# Patient Record
Sex: Male | Born: 1985 | Race: Black or African American | Hispanic: No | Marital: Married | Smoking: Current every day smoker
Health system: Southern US, Community
[De-identification: ages and names within clinical notes are randomized; demographics above are authoritative.]

---
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
## 2009-06-17 ENCOUNTER — Emergency Department (HOSPITAL_COMMUNITY): Admission: EM | Admit: 2009-06-17 | Discharge: 2009-06-17 | Payer: Self-pay | Admitting: Emergency Medicine
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
## 2010-08-16 ENCOUNTER — Emergency Department (HOSPITAL_COMMUNITY): Admission: EM | Admit: 2010-08-16 | Discharge: 2010-08-17 | Payer: Self-pay | Admitting: Emergency Medicine

## 2011-02-14 IMAGING — CR DG CHEST 1V PORT
1 series · 1 of 1 positions shown · non-contrast
Comparison: None.

CLINICAL DATA: 23-year-old male with chest pain, fever.

PORTABLE CHEST - 1 VIEW

[view not recorded]
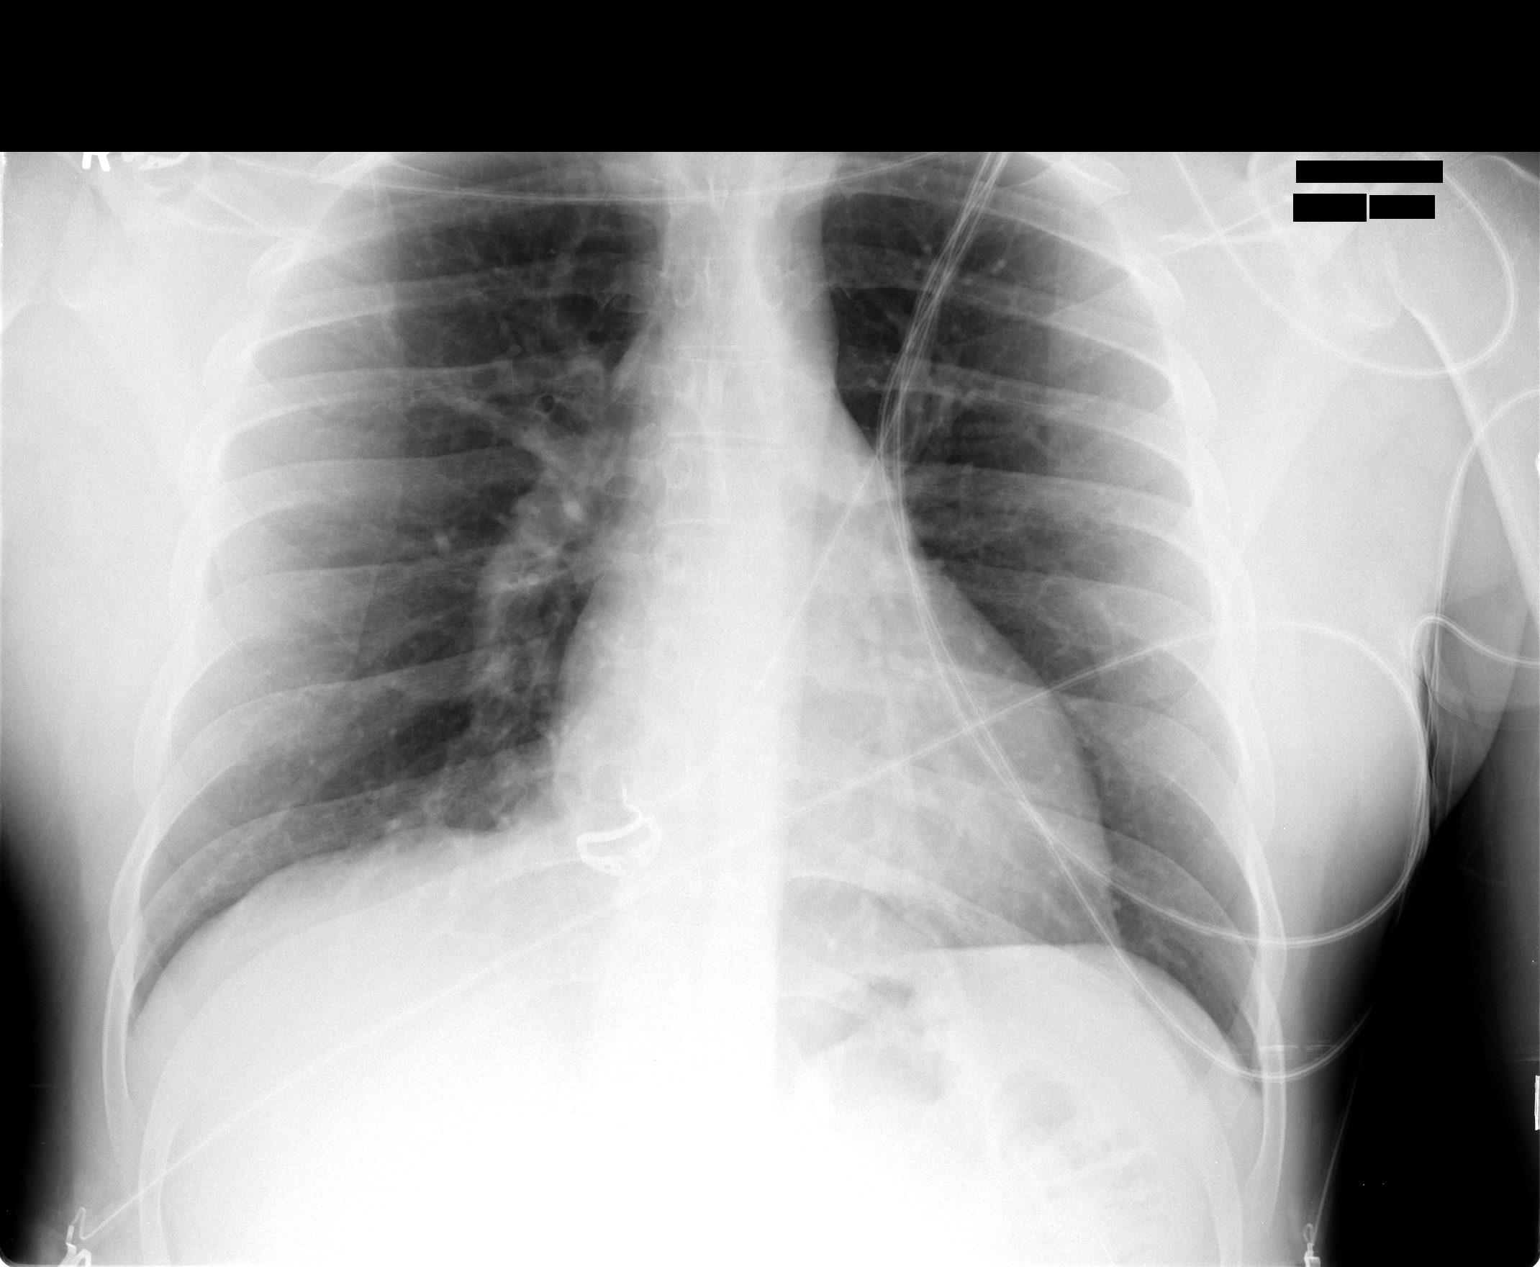

[1 of 1 positions shown; findings below may reference images not displayed]

FINDINGS: AP portable semi upright view at 6609 hours.  Lung
volumes are within normal limits.  Cardiac size and mediastinal
contours are within normal limits.  Visualized tracheal air column
is within normal limits.  Allowing for portable technique, the
lungs are clear.  No pneumothorax or effusion.
IMPRESSION: Negative, no acute cardiopulmonary abnormality.

## 2011-03-10 LAB — CBC
HCT: 44.8 % (ref 39.0–52.0)
Hemoglobin: 14.9 g/dL (ref 13.0–17.0)
MCH: 28.8 pg (ref 26.0–34.0)
MCHC: 33.3 g/dL (ref 30.0–36.0)
MCV: 86.4 fL (ref 78.0–100.0)
Platelets: 188 10*3/uL (ref 150–400)
RBC: 5.19 MIL/uL (ref 4.22–5.81)
RDW: 13.7 % (ref 11.5–15.5)
WBC: 10.8 10*3/uL — ABNORMAL HIGH (ref 4.0–10.5)

## 2011-03-10 LAB — BASIC METABOLIC PANEL
BUN: 10 mg/dL (ref 6–23)
CO2: 27 mEq/L (ref 19–32)
Calcium: 9.3 mg/dL (ref 8.4–10.5)
Chloride: 103 mEq/L (ref 96–112)
Creatinine, Ser: 1.18 mg/dL (ref 0.4–1.5)
GFR calc Af Amer: >60 mL/min (ref >60)
GFR calc non Af Amer: >60 mL/min (ref >60)
Glucose, Bld: 98 mg/dL (ref 70–99)
Potassium: 3.8 mEq/L (ref 3.5–5.1)
Sodium: 137 mEq/L (ref 135–145)

## 2011-03-10 LAB — DIFFERENTIAL
Basophils Relative: 0 % (ref 0–1)
Eosinophils Absolute: 0 10*3/uL (ref 0.0–0.7)
Eosinophils Relative: 0 % (ref 0–5)
Lymphs Abs: 0.4 10*3/uL — ABNORMAL LOW (ref 0.7–4.0)
Monocytes Absolute: 0.8 10*3/uL (ref 0.1–1.0)
Monocytes Relative: 7 % (ref 3–12)

## 2011-03-10 LAB — POCT CARDIAC MARKERS
CKMB, poc: <1 ng/mL — ABNORMAL LOW (ref 1.0–8.0)
Myoglobin, poc: 42.8 ng/mL (ref 12–200)
Myoglobin, poc: 45.2 ng/mL (ref 12–200)
Troponin i, poc: <0.05 ng/mL (ref 0.00–0.09)

## 2012-06-10 ENCOUNTER — Emergency Department (HOSPITAL_COMMUNITY): Payer: BC Managed Care – PPO

## 2012-06-10 ENCOUNTER — Encounter (HOSPITAL_COMMUNITY): Payer: Self-pay | Admitting: Emergency Medicine

## 2012-06-10 ENCOUNTER — Emergency Department (HOSPITAL_COMMUNITY)
Admission: EM | Admit: 2012-06-10 | Discharge: 2012-06-10 | Disposition: A | Discharge status: Home / Self Care | Payer: BC Managed Care – PPO | Attending: Emergency Medicine | Admitting: Emergency Medicine

## 2012-06-10 DIAGNOSIS — R079 Chest pain, unspecified: Secondary | ICD-10-CM | POA: Insufficient documentation

## 2012-06-10 LAB — DIFFERENTIAL
Basophils Absolute: 0 K/uL (ref 0.0–0.1)
Basophils Relative: 0 % (ref 0–1)
Eosinophils Absolute: 0.1 K/uL (ref 0.0–0.7)
Eosinophils Relative: 1 % (ref 0–5)
Lymphocytes Relative: 19 % (ref 12–46)
Lymphs Abs: 1.8 K/uL (ref 0.7–4.0)
Monocytes Absolute: 0.8 K/uL (ref 0.1–1.0)
Monocytes Relative: 8 % (ref 3–12)
Neutro Abs: 7 K/uL (ref 1.7–7.7)
Neutrophils Relative %: 72 % (ref 43–77)

## 2012-06-10 LAB — BASIC METABOLIC PANEL WITH GFR
BUN: 13 mg/dL (ref 6–23)
CO2: 23 meq/L (ref 19–32)
Calcium: 8.9 mg/dL (ref 8.4–10.5)
Chloride: 107 meq/L (ref 96–112)
Creatinine, Ser: 1.03 mg/dL (ref 0.50–1.35)
GFR calc Af Amer: >90 mL/min
GFR calc non Af Amer: >90 mL/min
Glucose, Bld: 94 mg/dL (ref 70–99)
Potassium: 3.5 meq/L (ref 3.5–5.1)
Sodium: 141 meq/L (ref 135–145)

## 2012-06-10 LAB — POCT I-STAT TROPONIN I
Troponin i, poc: 0 ng/mL (ref 0.00–0.08)
Troponin i, poc: 0 ng/mL (ref 0.00–0.08)

## 2012-06-10 LAB — CBC
MCV: 82.9 fL (ref 78.0–100.0)
Platelets: 207 10*3/uL (ref 150–400)
RBC: 4.92 MIL/uL (ref 4.22–5.81)
RDW: 12.8 % (ref 11.5–15.5)
WBC: 9.8 10*3/uL (ref 4.0–10.5)

## 2012-06-10 MED ORDER — NITROGLYCERIN 2 % TD OINT
1.0000 [in_us] | TOPICAL_OINTMENT | Freq: Once | TRANSDERMAL | Status: AC
  Administered 2012-06-10: 1 [in_us] via TOPICAL
  Filled 2012-06-10: qty 1

## 2012-06-10 NOTE — ED Provider Notes (Signed)
History     CSN: [id number]  Arrival date & time 06/10/12  1718   First MD Initiated Contact with Patient 06/10/12 1726      Chief Complaint  Patient presents with  . Chest Pain     Patient is a 26 y.o. male presenting with chest pain. The history is provided by the patient and the EMS personnel.  Chest Pain The chest pain began yesterday. Duration of episode(s) is 3 seconds. Chest pain occurs intermittently (every several minutes). The chest pain is resolved. The pain is associated with stress and exertion. The severity of the pain is moderate. The quality of the pain is described as aching, dull and heavy. Chest pain is worsened by stress and exertion. Pertinent negatives for primary symptoms include no fever, no shortness of breath, no cough, no wheezing, no palpitations, no abdominal pain, no nausea, no vomiting and no dizziness.  Pertinent negatives for associated symptoms include no diaphoresis and no weakness. He tried nitroglycerin for the symptoms. Risk factors include obesity and male gender.  Pertinent negatives for past medical history include no CAD, no cancer, no COPD, no CHF, no DVT, no MI and no PE.  Pertinent negatives for family medical history include: family history of aortic dissection, no CAD in family, no early MI in family, no PE in family and no sudden death in family.     No past medical history on file.  No past surgical history on file.  No family history on file.  History  Substance Use Topics  . Smoking status: Not on file  . Smokeless tobacco: Not on file  . Alcohol Use: Not on file      Review of Systems  Constitutional: Negative for fever, chills, diaphoresis, activity change and appetite change.  HENT: Negative for neck pain.   Respiratory: Positive for chest tightness. Negative for cough, shortness of breath and wheezing.   Cardiovascular: Positive for chest pain. Negative for palpitations and leg swelling.  Gastrointestinal: Negative for  nausea, vomiting, abdominal pain, diarrhea and constipation.  Skin: Negative for rash and wound.  Neurological: Negative for dizziness, weakness, light-headedness and headaches.  All other systems reviewed and are negative.    Allergies  Review of patient's allergies indicates no known allergies.  Home Medications  No current outpatient prescriptions on file.  BP 136/78  Temp 98.2 F (36.8 C) (Oral)  Resp 10  SpO2 98%  Physical Exam  Nursing note and vitals reviewed. Constitutional: He appears well-developed and well-nourished.       Overweight   HENT:  Head: Normocephalic and atraumatic.  Right Ear: External ear normal.  Left Ear: External ear normal.  Nose: Nose normal.  Mouth/Throat: Oropharynx is clear and moist. No oropharyngeal exudate.  Eyes: Conjunctivae are normal. Pupils are equal, round, and reactive to light.  Neck: Normal range of motion. Neck supple.  Cardiovascular: Normal rate, regular rhythm, normal heart sounds and intact distal pulses.   Pulmonary/Chest: Effort normal and breath sounds normal. No respiratory distress. He has no wheezes. He has no rales. He exhibits no tenderness.  Abdominal: Soft. Bowel sounds are normal. He exhibits no distension and no mass. There is no tenderness. There is no rebound and no guarding.  Musculoskeletal: Normal range of motion. He exhibits no edema and no tenderness.  Neurological: He is alert. He displays normal reflexes. No cranial nerve deficit. He exhibits normal muscle tone. Coordination normal.  Skin: Skin is warm and dry. No rash noted. No erythema. No pallor.  Psychiatric: He has a normal mood and affect. His behavior is normal. Judgment and thought content normal.    ED Course  Procedures (including critical care time)   Labs Reviewed  CBC  DIFFERENTIAL  BASIC METABOLIC PANEL  POCT I-STAT TROPONIN I  POCT I-STAT TROPONIN I   Dg Chest Portable 1 View  06/10/2012  *RADIOLOGY REPORT*  Clinical Data:  Midline chest pain.  CHEST - 1 VIEW  Comparison:  08/16/2010  Findings: The heart size and mediastinal contours are within normal limits.  Both lungs are clear.  IMPRESSION: No active disease.  Original Report Authenticated By: Danae Orleans, M.D.     1. Chest pain      Date: 06/10/2012  Rate: 89 bpm  Rhythm: normal sinus rhythm  QRS Axis: normal  Intervals: normal  ST/T Wave abnormalities: ST depressions inferiorly and laterally; J point elevation in septal leads  Conduction Disutrbances:none  Narrative Interpretation: J point elevation likely early repolarization  Old EKG Reviewed: changes noted; new inverted T waves in inferior leads (compared with prior EKG-08/16/10)    MDM  26 yo M presents for intermittent chest pain without associated symptoms. EKG via EMS concerning for ST elevation in anterior leads; however, ST elevation of EKG unchanged from previous EKG in August 2011. Cardiology at bedside at arrival and agrees this is not a ST elevation MI. 324mg  ASA given PTA. Pt chest pain free on arrival; nitropaste provided. Serial troponins negative. Chest pain did not return during ED visit. CXR not concerning for PNA or PTX. Suspect costochondritis; discussed usage of NSAID's. Patient given return precautions, including worsening of signs or symptoms. Patient instructed to follow-up with primary care physician.          Clemetine Marker, MD 06/10/12 2303

## 2012-06-10 NOTE — Discharge Instructions (Signed)
Chest Pain (Nonspecific) Chest pain has many causes. Your pain could be caused by something serious, such as a heart attack or a blood clot in the lungs. It could also be caused by something less serious, such as a chest bruise or a virus. Follow up with your doctor. More lab tests or other studies may be needed to find the cause of your pain. Most of the time, nonspecific chest pain will improve within 2 to 3 days of rest and mild pain medicine. HOME CARE  For chest bruises, you may put ice on the sore area for 15 to 20 minutes, 3 to 4 times a day. Do this only if it makes you or your child feel better.   Put ice in a plastic bag.   Place a towel between the skin and the bag.   Rest for the next 2 to 3 days.   Go back to work if the pain improves.   See your doctor if the pain lasts longer than 1 to 2 weeks.   Only take medicine as told by your doctor.   Quit smoking if you smoke.  GET HELP RIGHT AWAY IF:   There is more pain or pain that spreads to the arm, neck, jaw, back, or belly (abdomen).   You or your child has shortness of breath.   You or your child coughs more than usual or coughs up blood.   You or your child has very bad back or belly pain, feels sick to his or her stomach (nauseous), or throws up (vomits).   You or your child has very bad weakness.   You or your child passes out (faints).   You or your child has a temperature by mouth above 102 F (38.9 C), not controlled by medicine.  Any of these problems may be serious and may be an emergency. Do not wait to see if the problems will go away. Get medical help right away. Call your local emergency services 911 in [place] Do not drive yourself to the hospital. MAKE SURE YOU:   Understand these instructions.   Will watch this condition.   Will get help right away if you or your child is not doing well or gets worse.  Document Released: 05/29/2008 Document Revised: 11/30/2011 Document Reviewed:  05/29/2008 [employer] Patient Information 2012 [employer]. 

## 2012-06-10 NOTE — ED Notes (Signed)
Troponin results given to Dr. Sheldon by B. Chandni Gagan, EMT 
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
## 2012-06-10 NOTE — ED Notes (Signed)
Onset chest pain one day ago (while sitting) continued today at work. Pt reports he thought it was reflux so he waited a while to call. Pt reports feeling sob. EMS arrived gave pt 324 ASA then 2 0.4mg  subling. Nitro. IV 20G in left hand with NS going. Vitals signs stayed stable. Denies allergies.

## 2012-06-10 NOTE — Progress Notes (Signed)
Code STEMI called by [facility]. Pt is a 26 yo AAM with history of tobacco abuse who began having chest pain yesterday. His EKG shows J point elevation in the septal leads. There are diffuse ST changes with T Wave inversions. No chest pain at time of arrival. Comparison with old EKG from 2011 is grossly unchanged. Code STEMI was cancelled. Pt to be worked up by ED staff.   Ricardo [name] 5:27 PM 06/10/2012

## 2012-06-10 NOTE — ED Provider Notes (Signed)
I saw and evaluated the patient, reviewed the resident's note and I agree with the findings and plan.  EKG reviewed and agree  Pt with chest pain onset at work, brief, associated with SOB. Lasted a few seconds. EMS called Code STEMI but no change from prior EKG here. Code STEMI cancelled. Will work up for CP.   Betti Goodenow B. Bernette Mayers, MD 06/10/12 1757

## 2012-12-09 IMAGING — CR DG CHEST 1V PORT
1 series · 1 of 1 positions shown · non-contrast
Comparison: 08/16/2010

CLINICAL DATA: Midline chest pain.

CHEST - 1 VIEW

[AP]
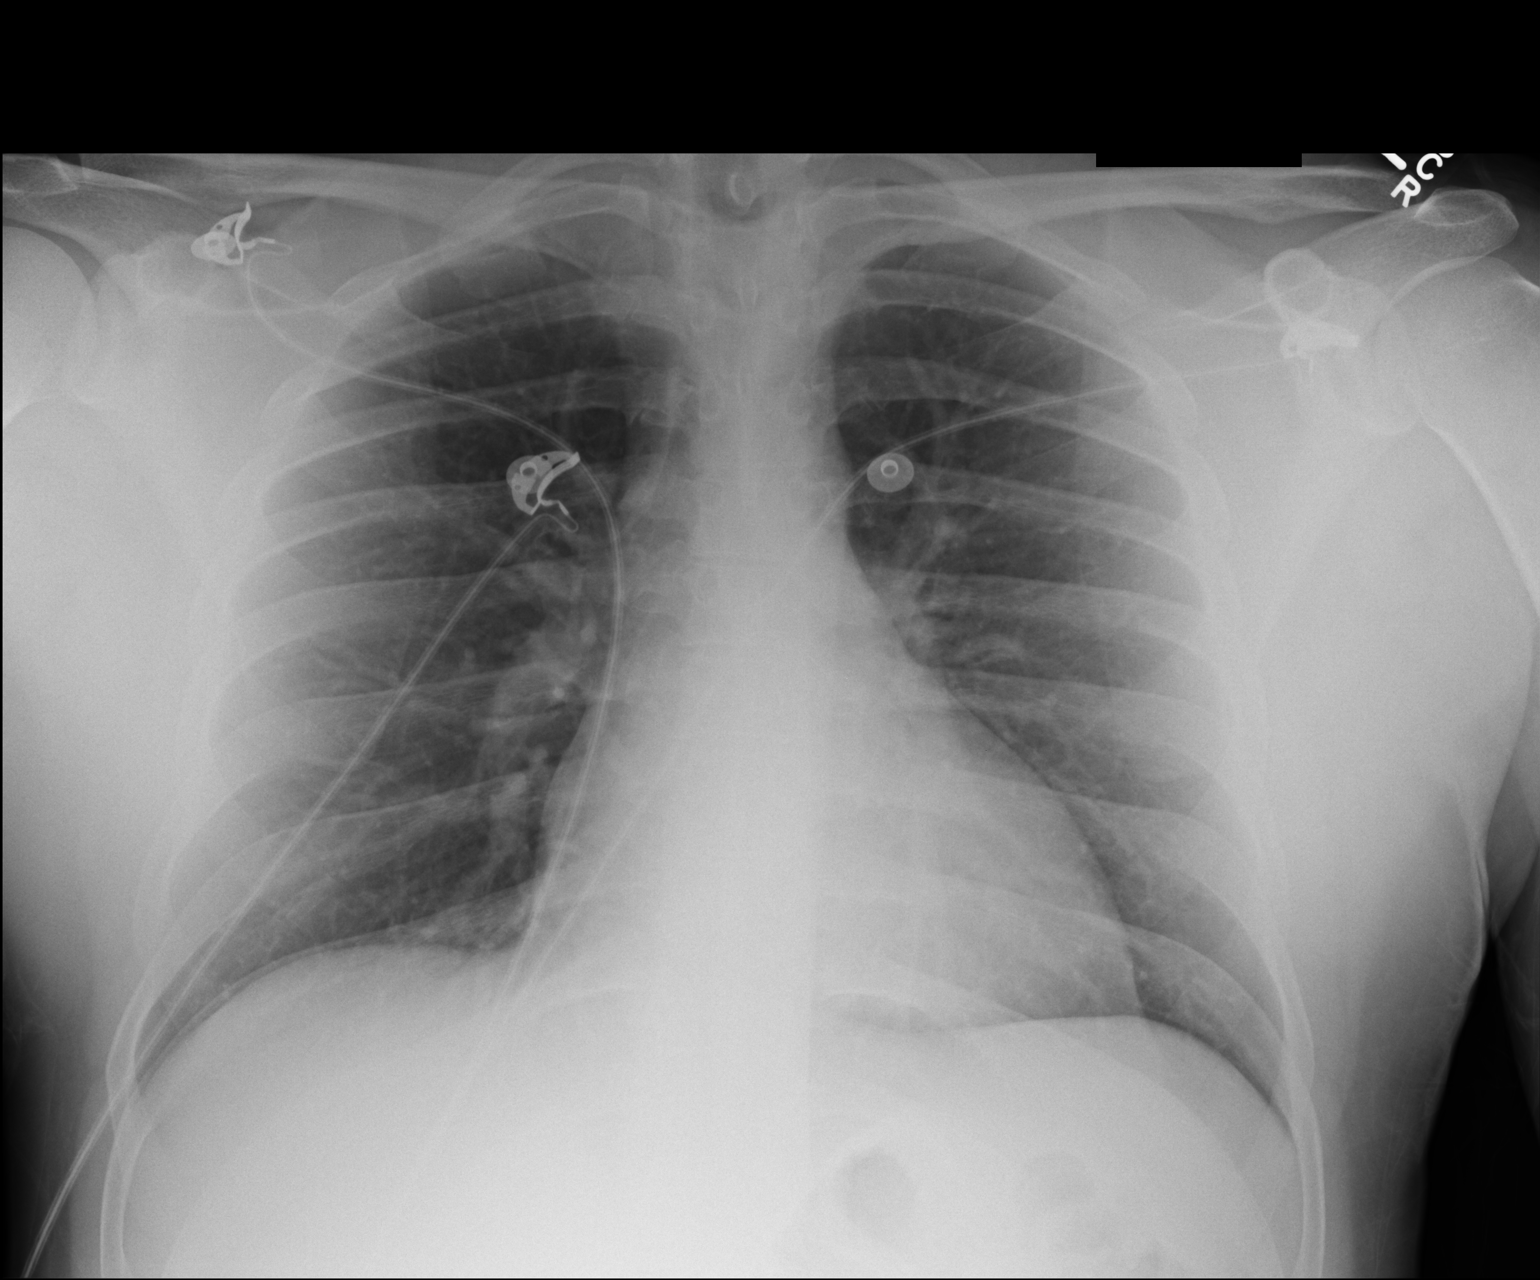

[1 of 1 positions shown; findings below may reference images not displayed]

FINDINGS: The heart size and mediastinal contours are within normal
limits.  Both lungs are clear.
IMPRESSION: No active disease.

## 2014-07-25 ENCOUNTER — Emergency Department (HOSPITAL_COMMUNITY): Admission: EM | Admit: 2014-07-25 | Payer: BC Managed Care – PPO | Source: Home / Self Care

## 2017-03-04 ENCOUNTER — Encounter (HOSPITAL_COMMUNITY): Payer: Self-pay | Admitting: *Deleted

## 2017-03-04 ENCOUNTER — Ambulatory Visit (HOSPITAL_COMMUNITY)
Admission: EM | Admit: 2017-03-04 | Discharge: 2017-03-04 | Disposition: A | Discharge status: Home / Self Care | Payer: BLUE CROSS/BLUE SHIELD | Attending: Family Medicine | Admitting: Family Medicine

## 2017-03-04 DIAGNOSIS — K529 Noninfective gastroenteritis and colitis, unspecified: Secondary | ICD-10-CM | POA: Diagnosis not present

## 2017-03-04 DIAGNOSIS — R110 Nausea: Secondary | ICD-10-CM

## 2017-03-04 MED ORDER — ONDANSETRON 8 MG PO TBDP: 8.0000 mg | ORAL_TABLET | Freq: Three times a day (TID) | ORAL | 0 refills | Status: AC | PRN

## 2017-03-04 NOTE — ED Provider Notes (Signed)
CSN: [id number]     Arrival date & time 03/04/17  1317 History   None    Chief Complaint  Patient presents with  . Emesis  . Diarrhea   (Consider location/radiation/quality/duration/timing/severity/associated sxs/prior Treatment) Patient c/o NVD x 2 days.   He is feeling much better but a little nauseated.   The history is provided by the patient.  Emesis  Severity:  Mild Duration:  2 days Timing:  Constant Able to tolerate:  Liquids Progression:  Worsening Chronicity:  New Recent urination:  Normal Relieved by:  Nothing Ineffective treatments:  None tried Associated symptoms: diarrhea   Diarrhea  Associated symptoms: vomiting     History reviewed. No pertinent past medical history. History reviewed. No pertinent surgical history. No family history on file. Social History  Substance Use Topics  . Smoking status: Current Every Day Smoker    Packs/day: 0.50    Types: Cigarettes  . Smokeless tobacco: Not on file  . Alcohol use Yes    Review of Systems  Constitutional: Positive for fatigue.  HENT: Negative.   Eyes: Negative.   Respiratory: Negative.   Cardiovascular: Negative.   Gastrointestinal: Positive for diarrhea and vomiting.  Endocrine: Negative.   Genitourinary: Negative.   Musculoskeletal: Negative.   Allergic/Immunologic: Negative.   Neurological: Negative.   Hematological: Negative.   Psychiatric/Behavioral: Negative.     Allergies  Patient has no known allergies.  Home Medications   Prior to Admission medications   Medication Sig Start Date End Date Taking? Authorizing Provider  ondansetron (ZOFRAN ODT) 8 MG disintegrating tablet Take 1 tablet (8 mg total) by mouth every 8 (eight) hours as needed for nausea or vomiting. 03/04/17   Deatra CanterWilliam J Heran Campau, FNP   Meds Ordered and Administered this Visit  Medications - No data to display  BP 147/81   Pulse 96   Temp 98.4 F (36.9 C) (Oral)   Resp 16   SpO2 98%  No data found.   Physical Exam   Constitutional: He is oriented to person, place, and time. He appears well-developed and well-nourished.  HENT:  Head: Normocephalic and atraumatic.  Right Ear: External ear normal.  Left Ear: External ear normal.  Mouth/Throat: Oropharynx is clear and moist.  Eyes: Conjunctivae and EOM are normal. Pupils are equal, round, and reactive to light.  Neck: Normal range of motion. Neck supple.  Cardiovascular: Normal rate, regular rhythm and normal heart sounds.   Pulmonary/Chest: Effort normal and breath sounds normal.  Abdominal: Soft. Bowel sounds are normal.  Musculoskeletal: Normal range of motion.  Neurological: He is alert and oriented to person, place, and time.  Nursing note and vitals reviewed.   Urgent Care Course     Procedures (including critical care time)  Labs Review Labs Reviewed - No data to display  Imaging Review No results found.   Visual Acuity Review  Right Eye Distance:   Left Eye Distance:   Bilateral Distance:    Right Eye Near:   Left Eye Near:    Bilateral Near:         MDM   1. Gastroenteritis   2. Nausea    Zofran Push po fluids, rest, tylenol and motrin otc prn as directed for fever, arthralgias, and myalgias.  Follow up prn if sx's continue or persist.    Deatra CanterWilliam J Lylee Corrow, FNP 03/04/17 1427

## 2017-03-04 NOTE — ED Triage Notes (Signed)
C/O vomiting, diarrhea x 2 days, with significant improvement.  States had to leave work early today and was told to get cleared for work.  Denies pain or fevers.

## 2017-10-26 ENCOUNTER — Encounter (HOSPITAL_COMMUNITY): Payer: Self-pay | Admitting: Emergency Medicine

## 2017-10-26 ENCOUNTER — Ambulatory Visit (HOSPITAL_COMMUNITY)
Admission: EM | Admit: 2017-10-26 | Discharge: 2017-10-26 | Disposition: A | Discharge status: Home / Self Care | Payer: BLUE CROSS/BLUE SHIELD | Attending: Internal Medicine | Admitting: Internal Medicine

## 2017-10-26 DIAGNOSIS — B349 Viral infection, unspecified: Secondary | ICD-10-CM

## 2017-10-26 MED ORDER — NAPROXEN 500 MG PO TABS: 500.0000 mg | ORAL_TABLET | Freq: Two times a day (BID) | ORAL | 0 refills | Status: AC

## 2017-10-26 MED ORDER — FLUTICASONE PROPIONATE 50 MCG/ACT NA SUSP: 2.0000 | Freq: Every day | NASAL | 0 refills | Status: AC

## 2017-10-26 MED ORDER — CETIRIZINE-PSEUDOEPHEDRINE ER 5-120 MG PO TB12: 1.0000 | ORAL_TABLET | Freq: Every day | ORAL | 0 refills | Status: AC

## 2017-10-26 NOTE — Discharge Instructions (Signed)
Start flonase, zyrtec-D for nasal congestion. You can use over the counter nasal saline rinse such as neti pot for nasal congestion. Keep hydrated, your urine should be clear to pale yellow in color. Tylenol/motrin for fever and pain. Monitor for any worsening of symptoms, chest pain, shortness of breath, wheezing, swelling of the throat, follow up for reevaluation.  ° °

## 2017-10-26 NOTE — ED Triage Notes (Signed)
Pt reports having a really bad headache and body aches on Tuesday.  Since then he has been very fatigued, congestion, and chills.  Pt has not been to work since Wednesday and has just been at home sleeping and drinking fluids.

## 2017-10-26 NOTE — ED Provider Notes (Signed)
[facility]    CSN: [id number] Arrival date & time: 10/26/17  1034     History   Chief Complaint Chief Complaint  Patient presents with  . Influenza    HPI Ricardo [name] IV is a 31 y.o. male.   31 year old healthy male comes in for 4 day history of headache and body aches. States he has been feeling fatigued and has had chills. Minimal cough. Nasal congestion. Denies fever. Had a few episodes of loose stools, denies abdominal pain, nausea, vomiting. Right/middle frontal headache, pressure that is improved with ibuprofen. He has been staying home resting and drinking fluids. He states that he is feeling better, but work required him to come in for evaluation as has been home.       History reviewed. No pertinent past medical history.  There are no active problems to display for this patient.   History reviewed. No pertinent surgical history.     Home Medications    Prior to Admission medications   Medication Sig Start Date End Date Taking? Authorizing Provider  cetirizine-pseudoephedrine (ZYRTEC-D) 5-120 MG tablet Take 1 tablet by mouth daily. 10/26/17   Cathie Hoops, Adreena Willits V, PA-C  fluticasone (FLONASE) 50 MCG/ACT nasal spray Place 2 sprays into both nostrils daily. 10/26/17   Cathie Hoops, Boone Gear V, PA-C  naproxen (NAPROSYN) 500 MG tablet Take 1 tablet (500 mg total) by mouth 2 (two) times daily. 10/26/17 11/05/17  Cathie Hoops, Annah Jasko V, PA-C  ondansetron (ZOFRAN ODT) 8 MG disintegrating tablet Take 1 tablet (8 mg total) by mouth every 8 (eight) hours as needed for nausea or vomiting. 03/04/17   Deatra Canter, FNP    Family History History reviewed. No pertinent family history.  Social History Social History  Substance Use Topics  . Smoking status: Current Every Day Smoker    Packs/day: 0.50    Types: Cigarettes  . Smokeless tobacco: Never Used  . Alcohol use Yes     Allergies   Patient has no known allergies.   Review of Systems Review of Systems  Reason unable to  perform ROS: See HPI as above.     Physical Exam Triage Vital Signs ED Triage Vitals  Enc Vitals Group     BP 10/26/17 1109 127/83     Pulse Rate 10/26/17 1109 87     Resp --      Temp 10/26/17 1109 98 F (36.7 C)     Temp Source 10/26/17 1109 Oral     SpO2 10/26/17 1109 99 %     Weight --      Height --      Head Circumference --      Peak Flow --      Pain Score 10/26/17 1108 5     Pain Loc --      Pain Edu? --      Excl. in GC? --    No data found.   Updated Vital Signs BP 127/83 (BP Location: Left Arm)   Pulse 87   Temp 98 F (36.7 C) (Oral)   SpO2 99%    Physical Exam  Constitutional: He is oriented to person, place, and time. He appears well-developed and well-nourished. No distress.  HENT:  Head: Normocephalic and atraumatic.  Right Ear: External ear and ear canal normal.  Left Ear: External ear and ear canal normal.  Nose: Right sinus exhibits frontal sinus tenderness. Right sinus exhibits no maxillary sinus tenderness. Left sinus exhibits frontal sinus tenderness. Left sinus exhibits  no maxillary sinus tenderness.  Mouth/Throat: Uvula is midline, oropharynx is clear and moist and mucous membranes are normal.  Cerumen impaction bilaterally. TM not visible.   Eyes: Pupils are equal, round, and reactive to light. Conjunctivae are normal.  Neck: Normal range of motion. Neck supple.  Cardiovascular: Normal rate, regular rhythm and normal heart sounds.  Exam reveals no gallop and no friction rub.   No murmur heard. Pulmonary/Chest: Effort normal and breath sounds normal. He has no decreased breath sounds. He has no wheezes. He has no rhonchi. He has no rales.  Abdominal: Soft. Bowel sounds are normal. He exhibits no distension. There is no tenderness. There is no rebound and no guarding.  Lymphadenopathy:    He has no cervical adenopathy.  Neurological: He is alert and oriented to person, place, and time.  Skin: Skin is warm and dry.  Psychiatric: He has a  normal mood and affect. His behavior is normal. Judgment normal.     UC Treatments / Results  Labs (all labs ordered are listed, but only abnormal results are displayed) Labs Reviewed - No data to display  EKG  EKG Interpretation None       Radiology No results found.  Procedures Procedures (including critical care time)  Medications Ordered in UC Medications - No data to display   Initial Impression / Assessment and Plan / UC Course  I have reviewed the triage vital signs and the nursing notes.  Pertinent labs & imaging results that were available during my care of the patient were reviewed by me and considered in my medical decision making (see chart for details).    Discussed with patient history and exam most consistent with viral URI. Symptomatic treatment as needed. Push fluids. Return precautions [name].   Final Clinical Impressions(s) / UC Diagnoses   Final diagnoses:  Viral illness    New Prescriptions Discharge Medication List as of 10/26/2017 11:40 AM    START taking these medications   Details  cetirizine-pseudoephedrine (ZYRTEC-D) 5-120 MG tablet Take 1 tablet by mouth daily., Starting Fri 10/26/2017, Normal    fluticasone (FLONASE) 50 MCG/ACT nasal spray Place 2 sprays into both nostrils daily., Starting Fri 10/26/2017, Normal    naproxen (NAPROSYN) 500 MG tablet Take 1 tablet (500 mg total) by mouth 2 (two) times daily., Starting Fri 10/26/2017, Until Mon 11/05/2017, Normal          Linward HeadlandYu, Roshunda Keir V, PA-C 10/26/17 1154
